# Patient Record
Sex: Female | Born: 1985 | Hispanic: No | Marital: Single | Smoking: Current every day smoker
Health system: Southern US, Community
[De-identification: ages and names within clinical notes are randomized; demographics above are authoritative.]

## PROBLEM LIST (undated history)

## (undated) DIAGNOSIS — F319 Bipolar disorder, unspecified: Secondary | ICD-10-CM

## (undated) DIAGNOSIS — I10 Essential (primary) hypertension: Secondary | ICD-10-CM

## (undated) DIAGNOSIS — F419 Anxiety disorder, unspecified: Secondary | ICD-10-CM

## (undated) DIAGNOSIS — R911 Solitary pulmonary nodule: Secondary | ICD-10-CM

## (undated) DIAGNOSIS — D739 Disease of spleen, unspecified: Secondary | ICD-10-CM

## (undated) DIAGNOSIS — F4310 Post-traumatic stress disorder, unspecified: Secondary | ICD-10-CM

## (undated) DIAGNOSIS — K769 Liver disease, unspecified: Secondary | ICD-10-CM

## (undated) DIAGNOSIS — D7389 Other diseases of spleen: Secondary | ICD-10-CM

## (undated) DIAGNOSIS — Z98891 History of uterine scar from previous surgery: Secondary | ICD-10-CM

## (undated) DIAGNOSIS — E119 Type 2 diabetes mellitus without complications: Secondary | ICD-10-CM

## (undated) DIAGNOSIS — B009 Herpesviral infection, unspecified: Secondary | ICD-10-CM

## (undated) DIAGNOSIS — R0989 Other specified symptoms and signs involving the circulatory and respiratory systems: Secondary | ICD-10-CM

## (undated) HISTORY — DX: Post-traumatic stress disorder, unspecified: F43.10

## (undated) HISTORY — DX: Bipolar disorder, unspecified: F31.9

## (undated) HISTORY — PX: OTHER SURGICAL HISTORY: SHX169

## (undated) HISTORY — DX: Anxiety disorder, unspecified: F41.9

## (undated) HISTORY — DX: History of uterine scar from previous surgery: Z98.891

---

## 2015-04-07 LAB — OB RESULTS CONSOLE ABO/RH: RH Type: POSITIVE

## 2015-04-07 LAB — OB RESULTS CONSOLE HEPATITIS B SURFACE ANTIGEN: HEP B S AG: NEGATIVE

## 2015-04-07 LAB — OB RESULTS CONSOLE RUBELLA ANTIBODY, IGM: RUBELLA: NON-IMMUNE/NOT IMMUNE

## 2015-04-07 LAB — OB RESULTS CONSOLE HIV ANTIBODY (ROUTINE TESTING): HIV: NONREACTIVE

## 2015-04-07 LAB — OB RESULTS CONSOLE GC/CHLAMYDIA
CHLAMYDIA, DNA PROBE: NEGATIVE
GC PROBE AMP, GENITAL: NEGATIVE

## 2015-04-07 LAB — OB RESULTS CONSOLE RPR: RPR: NONREACTIVE

## 2015-04-07 LAB — OB RESULTS CONSOLE ANTIBODY SCREEN: ANTIBODY SCREEN: NEGATIVE

## 2015-07-13 ENCOUNTER — Encounter: Payer: Medicaid Other | Attending: Obstetrics and Gynecology

## 2015-07-13 VITALS — Ht 67.0 in | Wt 200.5 lb

## 2015-07-13 DIAGNOSIS — O24419 Gestational diabetes mellitus in pregnancy, unspecified control: Secondary | ICD-10-CM | POA: Insufficient documentation

## 2015-07-13 DIAGNOSIS — Z713 Dietary counseling and surveillance: Secondary | ICD-10-CM | POA: Insufficient documentation

## 2015-07-19 NOTE — Progress Notes (Signed)
  Patient was seen on 07/13/15 for Gestational Diabetes self-management . The following learning objectives were met by the patient :   States the definition of Gestational Diabetes  States why dietary management is important in controlling blood glucose  Describes the effects of carbohydrates on blood glucose levels  Demonstrates ability to create a balanced meal plan  Demonstrates carbohydrate counting   States when to check blood glucose levels  Demonstrates proper blood glucose monitoring techniques  States the effect of stress and exercise on blood glucose levels  States the importance of limiting caffeine and abstaining from alcohol and smoking  Plan:  Aim for 2 Carb Choices per meal (30 grams) +/- 1 either way for breakfast Aim for 3 Carb Choices per meal (45 grams) +/- 1 either way from lunch and dinner Aim for 1-2 Carbs per snack Begin reading food labels for Total Carbohydrate and sugar grams of foods Consider  increasing your activity level by walking daily as tolerated Begin checking BG before breakfast and 1-2 hours after first bit of breakfast, lunch and dinner after  as directed by MD  Take medication  as directed by MD  Blood glucose monitor given: [employer] Lot # [id number]   Exp: 09-12-16  Patient instructed to monitor glucose levels: FBS: 60 - <90 1 hour: <140 2 hour: <120  Patient received the following handouts:  Nutrition Diabetes and Pregnancy  Carbohydrate Counting List  Meal Planning worksheet  Patient will be seen for follow-up as needed.

## 2015-09-06 ENCOUNTER — Encounter (HOSPITAL_COMMUNITY): Payer: Self-pay

## 2015-09-07 ENCOUNTER — Encounter (HOSPITAL_COMMUNITY)
Admission: RE | Admit: 2015-09-07 | Discharge: 2015-09-07 | Disposition: A | Discharge status: Home / Self Care | Payer: Medicaid Other | Source: Ambulatory Visit | Attending: Obstetrics and Gynecology | Admitting: Obstetrics and Gynecology

## 2015-09-07 ENCOUNTER — Encounter (HOSPITAL_COMMUNITY): Payer: Self-pay

## 2015-09-07 DIAGNOSIS — Z01812 Encounter for preprocedural laboratory examination: Secondary | ICD-10-CM | POA: Diagnosis present

## 2015-09-07 HISTORY — DX: Solitary pulmonary nodule: R91.1

## 2015-09-07 HISTORY — DX: Herpesviral infection, unspecified: B00.9

## 2015-09-07 HISTORY — DX: Liver disease, unspecified: K76.9

## 2015-09-07 HISTORY — DX: Type 2 diabetes mellitus without complications: E11.9

## 2015-09-07 HISTORY — DX: Disease of spleen, unspecified: D73.9

## 2015-09-07 HISTORY — DX: Essential (primary) hypertension: I10

## 2015-09-07 HISTORY — DX: Other specified symptoms and signs involving the circulatory and respiratory systems: R09.89

## 2015-09-07 HISTORY — DX: Other diseases of spleen: D73.89

## 2015-09-07 LAB — CBC
HEMATOCRIT: 41.3 % (ref 36.0–46.0)
HEMOGLOBIN: 14.3 g/dL (ref 12.0–15.0)
MCH: 31.2 pg (ref 26.0–34.0)
MCHC: 34.6 g/dL (ref 30.0–36.0)
MCV: 90 fL (ref 78.0–100.0)
PLATELETS: 236 10*3/uL (ref 150–400)
RBC: 4.59 MIL/uL (ref 3.87–5.11)
RDW: 13.4 % (ref 11.5–15.5)
WBC: 13.1 10*3/uL — AB (ref 4.0–10.5)

## 2015-09-07 LAB — ABO/RH: ABO/RH(D): B POS

## 2015-09-07 LAB — TYPE AND SCREEN
ABO/RH(D): B POS
Antibody Screen: NEGATIVE

## 2015-09-07 NOTE — Patient Instructions (Signed)
Your procedure is scheduled on:  Friday, Jan. 27, 2017  Enter through the Main Entrance of [facility] at:  7:00 A.M.  Pick up the phone at the desk and dial [phone].  Call this number if you have problems the morning of surgery: [phone].  Remember: Do NOT eat food or drink after:  After Midnight Thursday Take these medicines the morning of surgery with a SIP OF WATER:  Zoloft, Valtrex  Do NOT wear jewelry (body piercing), metal hair clips/bobby pins, or nail polish. Do NOT wear lotions, powders, or perfumes.  You may wear deoderant. Do NOT shave for 48 hours prior to surgery. Do NOT bring valuables to the hospital.  Leave suitcase in car.  After surgery it may be brought to your room.  For patients admitted to the hospital, checkout time is 11:00 AM the day of discharge.

## 2015-09-08 LAB — RPR: RPR Ser Ql: NONREACTIVE

## 2015-09-08 MED ORDER — GENTAMICIN SULFATE 40 MG/ML IJ SOLN
INTRAVENOUS | Status: AC
  Administered 2015-09-09: 370 mL via INTRAVENOUS
  Filled 2015-09-08: qty 9.25

## 2015-09-08 NOTE — H&P (Signed)
Shelly Little is a 30 y.o. female, G5 P1031, EGA [redacted] weeks with EDC 2-3 presenting for repeat c-section and BTL.  Previous LTCS, declines VBAC.  PNC complicated by A2GDM controlled with Glyburide, reactive NSTs, on Valtrex for suppression of HSV, desires BTL.   Maternal Medical History:  Fetal activity: Perceived fetal activity is normal.    Prenatal Complications - Diabetes: gestational. Diabetes is managed by oral agent (monotherapy).      OB History    Gravida Para Term Preterm AB TAB SAB Ectopic Multiple Living   LTCS for PIH and polyhydramnios, EAB x 2, SAB x 1  Past Medical History  Diagnosis Date  . Bipolar affective disorder (HCC)   . Anxiety   . PTSD (post-traumatic stress disorder)   . H/O: C-section   . Hypertension     patient's states occassionally is elevated since last pregnancy  . Poor circulation of extremity (HCC)     bilateral legs go numb when patient squats  . Diabetes mellitus without complication (HCC)     gestational with current pregnancy  . HSV infection   . Lesion of liver     age: 60 months  . Lesion of lung     age:73 months  . Lesion of spleen     age: 53 months   Past Surgical History  Procedure Laterality Date  . Cesarean section    . Skull surgery at 75 months of age     Family History: family history is not on file. Social History:  reports that she has been smoking Cigarettes.  She has a 8 pack-year smoking history. She has never used smokeless tobacco. She reports that she does not drink alcohol or use illicit drugs.   Prenatal Transfer Tool  Maternal Diabetes: Yes:  Diabetes Type:  Insulin/Medication controlled Genetic Screening: Normal Maternal Ultrasounds/Referrals: Normal Fetal Ultrasounds or other Referrals:  None Maternal Substance Abuse:  No Significant Maternal Medications:  Meds include: Other:  Significant Maternal Lab Results:  Lab values include: Group B Strep negative Other Comments:  Glyburide for  GDM  Review of Systems  Respiratory: Negative.   Cardiovascular: Negative.       Height  (1.702 m), weight 91.74 kg (202 lb 4 oz). Maternal Exam:  Abdomen: Surgical scars: low transverse.   Estimated fetal weight is 7 1/2 lbs.   Fetal presentation: vertex  Introitus: Normal vulva. Normal vagina.    Physical Exam  Constitutional: She appears well-developed and well-nourished.  Neck: Neck supple. No thyromegaly present.  Cardiovascular: Normal rate, regular rhythm and normal heart sounds.   No murmur heard. Respiratory: Effort normal and breath sounds normal. No respiratory distress. She has no wheezes.  GI: Soft.    Prenatal labs: ABO, Rh: --/--/B POS, B POS (01/25 1120) Antibody: NEG (01/25 1120) Rubella: Nonimmune (08/25 0000) RPR: Non Reactive (01/25 1120)  HBsAg: Negative (08/25 0000)  HIV: Non-reactive (08/25 0000)  GBS:   neg  Assessment/Plan: IUP at 39 weeks, previous c-section, A2GDM, desires BTL.  Repeat c-section procedure and risks discussed, BTL risks, permanency and failure risks discussed.  Will admit for repeat c-section and BTL.     Deerica Waszak D 09/08/2015, 9:12 PM

## 2015-09-09 ENCOUNTER — Encounter (HOSPITAL_COMMUNITY)
Admission: RE | Disposition: A | Discharge status: Home / Self Care | Payer: Self-pay | Source: Ambulatory Visit | Attending: Obstetrics and Gynecology

## 2015-09-09 ENCOUNTER — Inpatient Hospital Stay (HOSPITAL_COMMUNITY): Payer: Medicaid Other | Admitting: Anesthesiology

## 2015-09-09 ENCOUNTER — Encounter (HOSPITAL_COMMUNITY): Payer: Self-pay

## 2015-09-09 ENCOUNTER — Inpatient Hospital Stay (HOSPITAL_COMMUNITY)
Admission: RE | Admit: 2015-09-09 | Discharge: 2015-09-11 | DRG: 767 | Disposition: A | Discharge status: Home / Self Care | Payer: Medicaid Other | Source: Ambulatory Visit | Attending: Obstetrics and Gynecology | Admitting: Obstetrics and Gynecology

## 2015-09-09 DIAGNOSIS — F4310 Post-traumatic stress disorder, unspecified: Secondary | ICD-10-CM | POA: Diagnosis present

## 2015-09-09 DIAGNOSIS — Z302 Encounter for sterilization: Secondary | ICD-10-CM

## 2015-09-09 DIAGNOSIS — B009 Herpesviral infection, unspecified: Secondary | ICD-10-CM | POA: Diagnosis present

## 2015-09-09 DIAGNOSIS — Z3A39 39 weeks gestation of pregnancy: Secondary | ICD-10-CM

## 2015-09-09 DIAGNOSIS — O9852 Other viral diseases complicating childbirth: Secondary | ICD-10-CM | POA: Diagnosis present

## 2015-09-09 DIAGNOSIS — F17210 Nicotine dependence, cigarettes, uncomplicated: Secondary | ICD-10-CM | POA: Diagnosis present

## 2015-09-09 DIAGNOSIS — F419 Anxiety disorder, unspecified: Secondary | ICD-10-CM | POA: Diagnosis present

## 2015-09-09 DIAGNOSIS — O99334 Smoking (tobacco) complicating childbirth: Secondary | ICD-10-CM | POA: Diagnosis present

## 2015-09-09 DIAGNOSIS — O99344 Other mental disorders complicating childbirth: Secondary | ICD-10-CM | POA: Diagnosis present

## 2015-09-09 DIAGNOSIS — O24425 Gestational diabetes mellitus in childbirth, controlled by oral hypoglycemic drugs: Secondary | ICD-10-CM | POA: Diagnosis present

## 2015-09-09 DIAGNOSIS — Z98891 History of uterine scar from previous surgery: Secondary | ICD-10-CM

## 2015-09-09 DIAGNOSIS — O34211 Maternal care for low transverse scar from previous cesarean delivery: Secondary | ICD-10-CM | POA: Diagnosis present

## 2015-09-09 DIAGNOSIS — O164 Unspecified maternal hypertension, complicating childbirth: Secondary | ICD-10-CM | POA: Diagnosis present

## 2015-09-09 LAB — GLUCOSE, CAPILLARY
GLUCOSE-CAPILLARY: 80 mg/dL (ref 65–99)
Glucose-Capillary: 92 mg/dL (ref 65–99)

## 2015-09-09 SURGERY — Surgical Case
Anesthesia: Spinal | Laterality: Bilateral

## 2015-09-09 MED ORDER — SCOPOLAMINE 1 MG/3DAYS TD PT72
1.0000 | MEDICATED_PATCH | Freq: Once | TRANSDERMAL | Status: DC
Start: 2015-09-09 — End: 2015-09-11

## 2015-09-09 MED ORDER — NICOTINE 21 MG/24HR TD PT24
21.0000 mg | MEDICATED_PATCH | Freq: Every day | TRANSDERMAL | Status: DC
  Administered 2015-09-09 – 2015-09-10 (×2): 21 mg via TRANSDERMAL
  Filled 2015-09-09 (×4): qty 1

## 2015-09-09 MED ORDER — DIPHENHYDRAMINE HCL 25 MG PO CAPS: 25.0000 mg | ORAL_CAPSULE | Freq: Four times a day (QID) | ORAL | Status: DC | PRN

## 2015-09-09 MED ORDER — LACTATED RINGERS IV SOLN
INTRAVENOUS | Status: DC
  Administered 2015-09-09: 23:00:00 via INTRAVENOUS

## 2015-09-09 MED ORDER — SCOPOLAMINE 1 MG/3DAYS TD PT72
MEDICATED_PATCH | TRANSDERMAL | Status: AC
  Administered 2015-09-09: 1.5 mg via TRANSDERMAL
  Filled 2015-09-09: qty 1

## 2015-09-09 MED ORDER — OLANZAPINE 2.5 MG PO TABS
2.5000 mg | ORAL_TABLET | Freq: Every day | ORAL | Status: DC
Start: 2015-09-09 — End: 2015-09-11
  Administered 2015-09-09 – 2015-09-10 (×2): 2.5 mg via ORAL
  Filled 2015-09-09 (×4): qty 1

## 2015-09-09 MED ORDER — OXYCODONE HCL 5 MG PO TABS
10.0000 mg | ORAL_TABLET | ORAL | Status: DC | PRN
  Administered 2015-09-09 – 2015-09-11 (×8): 10 mg via ORAL
  Filled 2015-09-09 (×8): qty 2

## 2015-09-09 MED ORDER — SIMETHICONE 80 MG PO CHEW
80.0000 mg | CHEWABLE_TABLET | ORAL | Status: DC | PRN
  Administered 2015-09-10 – 2015-09-11 (×2): 80 mg via ORAL
  Filled 2015-09-09 (×2): qty 1

## 2015-09-09 MED ORDER — MEPERIDINE HCL 25 MG/ML IJ SOLN: 6.2500 mg | INTRAMUSCULAR | Status: DC | PRN

## 2015-09-09 MED ORDER — FENTANYL CITRATE (PF) 100 MCG/2ML IJ SOLN
INTRAMUSCULAR | Status: AC
  Filled 2015-09-09: qty 2

## 2015-09-09 MED ORDER — LACTATED RINGERS IV SOLN
INTRAVENOUS | Status: DC
  Administered 2015-09-09 (×2): via INTRAVENOUS

## 2015-09-09 MED ORDER — KETOROLAC TROMETHAMINE 30 MG/ML IJ SOLN: 30.0000 mg | Freq: Once | INTRAMUSCULAR | Status: DC

## 2015-09-09 MED ORDER — PHENYLEPHRINE HCL 10 MG/ML IJ SOLN
INTRAMUSCULAR | Status: DC | PRN
  Administered 2015-09-09 (×2): 40 ug via INTRAVENOUS

## 2015-09-09 MED ORDER — SENNOSIDES-DOCUSATE SODIUM 8.6-50 MG PO TABS
2.0000 | ORAL_TABLET | ORAL | Status: DC
  Administered 2015-09-09 – 2015-09-11 (×2): 2 via ORAL
  Filled 2015-09-09 (×2): qty 2

## 2015-09-09 MED ORDER — NALBUPHINE HCL 10 MG/ML IJ SOLN: 5.0000 mg | INTRAMUSCULAR | Status: DC | PRN

## 2015-09-09 MED ORDER — SODIUM CHLORIDE 0.9 % IR SOLN
Status: DC | PRN
  Administered 2015-09-09: 1000 mL

## 2015-09-09 MED ORDER — MENTHOL 3 MG MT LOZG: 1.0000 | LOZENGE | OROMUCOSAL | Status: DC | PRN

## 2015-09-09 MED ORDER — SERTRALINE HCL 50 MG PO TABS
50.0000 mg | ORAL_TABLET | Freq: Every day | ORAL | Status: DC
  Administered 2015-09-10 – 2015-09-11 (×2): 50 mg via ORAL
  Filled 2015-09-09 (×3): qty 1

## 2015-09-09 MED ORDER — OXYCODONE HCL 5 MG PO TABS
5.0000 mg | ORAL_TABLET | ORAL | Status: DC | PRN
  Filled 2015-09-09: qty 1

## 2015-09-09 MED ORDER — ONDANSETRON HCL 4 MG/2ML IJ SOLN
INTRAMUSCULAR | Status: DC | PRN
  Administered 2015-09-09: 4 mg via INTRAVENOUS

## 2015-09-09 MED ORDER — PHENYLEPHRINE 8 MG IN D5W 100 ML (0.08MG/ML) PREMIX OPTIME
INJECTION | INTRAVENOUS | Status: DC | PRN
  Administered 2015-09-09: 60 ug/min via INTRAVENOUS

## 2015-09-09 MED ORDER — NALBUPHINE HCL 10 MG/ML IJ SOLN: 5.0000 mg | Freq: Once | INTRAMUSCULAR | Status: DC | PRN

## 2015-09-09 MED ORDER — PROMETHAZINE HCL 25 MG/ML IJ SOLN: 6.2500 mg | INTRAMUSCULAR | Status: DC | PRN

## 2015-09-09 MED ORDER — HYDROMORPHONE HCL 2 MG PO TABS
2.0000 mg | ORAL_TABLET | Freq: Once | ORAL | Status: AC
  Administered 2015-09-09: 2 mg via ORAL
  Filled 2015-09-09: qty 1

## 2015-09-09 MED ORDER — ONDANSETRON HCL 4 MG/2ML IJ SOLN: 4.0000 mg | Freq: Three times a day (TID) | INTRAMUSCULAR | Status: DC | PRN

## 2015-09-09 MED ORDER — TETANUS-DIPHTH-ACELL PERTUSSIS 5-2.5-18.5 LF-MCG/0.5 IM SUSP: 0.5000 mL | Freq: Once | INTRAMUSCULAR | Status: DC

## 2015-09-09 MED ORDER — PHENYLEPHRINE 40 MCG/ML (10ML) SYRINGE FOR IV PUSH (FOR BLOOD PRESSURE SUPPORT)
PREFILLED_SYRINGE | INTRAVENOUS | Status: AC
  Filled 2015-09-09: qty 10

## 2015-09-09 MED ORDER — KETOROLAC TROMETHAMINE 30 MG/ML IJ SOLN
30.0000 mg | Freq: Four times a day (QID) | INTRAMUSCULAR | Status: AC | PRN
  Administered 2015-09-09: 30 mg via INTRAMUSCULAR

## 2015-09-09 MED ORDER — ACETAMINOPHEN 325 MG PO TABS
650.0000 mg | ORAL_TABLET | ORAL | Status: DC | PRN
Start: 2015-09-09 — End: 2015-09-11
  Administered 2015-09-10 – 2015-09-11 (×5): 650 mg via ORAL
  Filled 2015-09-09 (×6): qty 2

## 2015-09-09 MED ORDER — NALOXONE HCL 2 MG/2ML IJ SOSY
1.0000 ug/kg/h | PREFILLED_SYRINGE | INTRAVENOUS | Status: DC | PRN
  Filled 2015-09-09: qty 2

## 2015-09-09 MED ORDER — MAGNESIUM HYDROXIDE 400 MG/5ML PO SUSP: 30.0000 mL | ORAL | Status: DC | PRN

## 2015-09-09 MED ORDER — IBUPROFEN 600 MG PO TABS: 600.0000 mg | ORAL_TABLET | Freq: Four times a day (QID) | ORAL | Status: DC | PRN

## 2015-09-09 MED ORDER — DIBUCAINE 1 % RE OINT: 1.0000 "application " | TOPICAL_OINTMENT | RECTAL | Status: DC | PRN

## 2015-09-09 MED ORDER — LACTATED RINGERS IV SOLN
INTRAVENOUS | Status: DC | PRN
  Administered 2015-09-09: 09:00:00 via INTRAVENOUS

## 2015-09-09 MED ORDER — MORPHINE SULFATE (PF) 0.5 MG/ML IJ SOLN
INTRAMUSCULAR | Status: AC
  Filled 2015-09-09: qty 10

## 2015-09-09 MED ORDER — DIPHENHYDRAMINE HCL 25 MG PO CAPS
25.0000 mg | ORAL_CAPSULE | ORAL | Status: DC | PRN
  Filled 2015-09-09: qty 1

## 2015-09-09 MED ORDER — IBUPROFEN 600 MG PO TABS
600.0000 mg | ORAL_TABLET | Freq: Four times a day (QID) | ORAL | Status: DC
  Administered 2015-09-09 – 2015-09-11 (×7): 600 mg via ORAL
  Filled 2015-09-09 (×7): qty 1

## 2015-09-09 MED ORDER — KETOROLAC TROMETHAMINE 30 MG/ML IJ SOLN
30.0000 mg | Freq: Four times a day (QID) | INTRAMUSCULAR | Status: AC | PRN
  Administered 2015-09-09: 30 mg via INTRAVENOUS
  Filled 2015-09-09 (×2): qty 1

## 2015-09-09 MED ORDER — KETOROLAC TROMETHAMINE 30 MG/ML IJ SOLN
INTRAMUSCULAR | Status: AC
  Filled 2015-09-09: qty 1

## 2015-09-09 MED ORDER — LANOLIN HYDROUS EX OINT: 1.0000 "application " | TOPICAL_OINTMENT | CUTANEOUS | Status: DC | PRN

## 2015-09-09 MED ORDER — HYDROMORPHONE HCL 1 MG/ML IJ SOLN
0.2500 mg | INTRAMUSCULAR | Status: DC | PRN
  Administered 2015-09-09: 0.25 mg via INTRAVENOUS

## 2015-09-09 MED ORDER — ONDANSETRON HCL 4 MG/2ML IJ SOLN
INTRAMUSCULAR | Status: AC
  Filled 2015-09-09: qty 2

## 2015-09-09 MED ORDER — BUPIVACAINE IN DEXTROSE 0.75-8.25 % IT SOLN
INTRATHECAL | Status: DC | PRN
  Administered 2015-09-09: 10.5 mg via INTRATHECAL

## 2015-09-09 MED ORDER — HYDROMORPHONE HCL 1 MG/ML IJ SOLN
INTRAMUSCULAR | Status: AC
  Filled 2015-09-09: qty 1

## 2015-09-09 MED ORDER — MORPHINE SULFATE (PF) 0.5 MG/ML IJ SOLN
INTRAMUSCULAR | Status: DC | PRN
  Administered 2015-09-09: .2 mg via INTRATHECAL

## 2015-09-09 MED ORDER — FENTANYL CITRATE (PF) 100 MCG/2ML IJ SOLN
INTRAMUSCULAR | Status: DC | PRN
  Administered 2015-09-09: 10 ug via INTRATHECAL

## 2015-09-09 MED ORDER — OXYTOCIN 10 UNIT/ML IJ SOLN: 2.5000 [IU]/h | INTRAVENOUS | Status: AC

## 2015-09-09 MED ORDER — SODIUM CHLORIDE 0.9% FLUSH: 3.0000 mL | INTRAVENOUS | Status: DC | PRN

## 2015-09-09 MED ORDER — MEASLES, MUMPS & RUBELLA VAC ~~LOC~~ INJ
0.5000 mL | INJECTION | Freq: Once | SUBCUTANEOUS | Status: AC
  Administered 2015-09-11: 0.5 mL via SUBCUTANEOUS
  Filled 2015-09-09 (×2): qty 0.5

## 2015-09-09 MED ORDER — NALOXONE HCL 0.4 MG/ML IJ SOLN: 0.4000 mg | INTRAMUSCULAR | Status: DC | PRN

## 2015-09-09 MED ORDER — SCOPOLAMINE 1 MG/3DAYS TD PT72
1.0000 | MEDICATED_PATCH | Freq: Once | TRANSDERMAL | Status: DC
Start: 2015-09-09 — End: 2015-09-09
  Administered 2015-09-09: 1.5 mg via TRANSDERMAL

## 2015-09-09 MED ORDER — PRENATAL MULTIVITAMIN CH
1.0000 | ORAL_TABLET | Freq: Every day | ORAL | Status: DC
  Administered 2015-09-10 – 2015-09-11 (×2): 1 via ORAL
  Filled 2015-09-09 (×2): qty 1

## 2015-09-09 MED ORDER — OXYTOCIN 10 UNIT/ML IJ SOLN
INTRAMUSCULAR | Status: AC
  Filled 2015-09-09: qty 4

## 2015-09-09 MED ORDER — WITCH HAZEL-GLYCERIN EX PADS: 1.0000 "application " | MEDICATED_PAD | CUTANEOUS | Status: DC | PRN

## 2015-09-09 MED ORDER — ACETAMINOPHEN 500 MG PO TABS
1000.0000 mg | ORAL_TABLET | Freq: Four times a day (QID) | ORAL | Status: AC
  Administered 2015-09-09 – 2015-09-10 (×3): 1000 mg via ORAL
  Filled 2015-09-09 (×3): qty 2

## 2015-09-09 MED ORDER — DIPHENHYDRAMINE HCL 50 MG/ML IJ SOLN: 12.5000 mg | INTRAMUSCULAR | Status: DC | PRN

## 2015-09-09 MED ORDER — ZOLPIDEM TARTRATE 5 MG PO TABS: 5.0000 mg | ORAL_TABLET | Freq: Every evening | ORAL | Status: DC | PRN

## 2015-09-09 MED ORDER — OXYTOCIN 10 UNIT/ML IJ SOLN
40.0000 [IU] | INTRAVENOUS | Status: DC | PRN
  Administered 2015-09-09: 40 [IU] via INTRAVENOUS

## 2015-09-09 MED ORDER — PHENYLEPHRINE 8 MG IN D5W 100 ML (0.08MG/ML) PREMIX OPTIME
INJECTION | INTRAVENOUS | Status: AC
  Filled 2015-09-09: qty 100

## 2015-09-09 SURGICAL SUPPLY — 36 items
BENZOIN TINCTURE PRP APPL 2/3 (GAUZE/BANDAGES/DRESSINGS) ×3 IMPLANT
CLAMP CORD UMBIL (MISCELLANEOUS) IMPLANT
CLOSURE STERI-STRIP 1/2X4 (GAUZE/BANDAGES/DRESSINGS) ×1
CLOTH BEACON ORANGE TIMEOUT ST (SAFETY) ×3 IMPLANT
CLSR STERI-STRIP ANTIMIC 1/2X4 (GAUZE/BANDAGES/DRESSINGS) ×2 IMPLANT
CONTAINER PREFILL 10% NBF 15ML (MISCELLANEOUS) IMPLANT
DRAPE SHEET LG 3/4 BI-LAMINATE (DRAPES) IMPLANT
DRSG OPSITE POSTOP 4X10 (GAUZE/BANDAGES/DRESSINGS) ×3 IMPLANT
DURAPREP 26ML APPLICATOR (WOUND CARE) ×3 IMPLANT
ELECT REM PT RETURN 9FT ADLT (ELECTROSURGICAL) ×3
ELECTRODE REM PT RTRN 9FT ADLT (ELECTROSURGICAL) ×1 IMPLANT
EXTRACTOR VACUUM KIWI (MISCELLANEOUS) IMPLANT
EXTRACTOR VACUUM M CUP 4 TUBE (SUCTIONS) IMPLANT
EXTRACTOR VACUUM M CUP 4' TUBE (SUCTIONS)
GLOVE BIO SURGEON STRL SZ8 (GLOVE) ×3 IMPLANT
GLOVE BIOGEL PI IND STRL 7.0 (GLOVE) ×1 IMPLANT
GLOVE BIOGEL PI INDICATOR 7.0 (GLOVE) ×2
GLOVE ORTHO TXT STRL SZ7.5 (GLOVE) ×3 IMPLANT
GOWN STRL REUS W/TWL LRG LVL3 (GOWN DISPOSABLE) ×6 IMPLANT
KIT ABG SYR 3ML LUER SLIP (SYRINGE) IMPLANT
NEEDLE HYPO 25X5/8 SAFETYGLIDE (NEEDLE) ×3 IMPLANT
NS IRRIG 1000ML POUR BTL (IV SOLUTION) ×3 IMPLANT
PACK C SECTION WH (CUSTOM PROCEDURE TRAY) ×3 IMPLANT
PAD OB MATERNITY 4.3X12.25 (PERSONAL CARE ITEMS) ×3 IMPLANT
PENCIL SMOKE EVAC W/HOLSTER (ELECTROSURGICAL) IMPLANT
RTRCTR C-SECT PINK 25CM LRG (MISCELLANEOUS) ×3 IMPLANT
SUT CHROMIC 1 CTX 36 (SUTURE) ×6 IMPLANT
SUT PLAIN 0 NONE (SUTURE) IMPLANT
SUT PLAIN 2 0 XLH (SUTURE) IMPLANT
SUT VIC AB 0 CT1 27 (SUTURE) ×4
SUT VIC AB 0 CT1 27XBRD ANBCTR (SUTURE) ×2 IMPLANT
SUT VIC AB 2-0 CT1 27 (SUTURE) ×2
SUT VIC AB 2-0 CT1 TAPERPNT 27 (SUTURE) ×1 IMPLANT
SUT VIC AB 4-0 KS 27 (SUTURE) ×3 IMPLANT
TOWEL OR 17X24 6PK STRL BLUE (TOWEL DISPOSABLE) ×3 IMPLANT
TRAY FOLEY CATH SILVER 14FR (SET/KITS/TRAYS/PACK) ×3 IMPLANT

## 2015-09-09 NOTE — Addendum Note (Signed)
Addendum  created 09/09/15 1630 by Shanon Payor, CRNA   Modules edited: Clinical Notes   Clinical Notes:  File: [id number]

## 2015-09-09 NOTE — Anesthesia Postprocedure Evaluation (Signed)
Anesthesia Post Note  Patient: Shelly Little  Procedure(s) Performed: Procedure(s) (LRB): CESAREAN SECTION WITH BILATERAL TUBAL LIGATION (Bilateral)  Patient location during evaluation: PACU Anesthesia Type: Spinal Level of consciousness: awake Pain management: pain level controlled Vital Signs Assessment: post-procedure vital signs reviewed and stable Respiratory status: spontaneous breathing Cardiovascular status: stable Postop Assessment: no headache, no backache, spinal receding, patient able to bend at knees and no signs of nausea or vomiting Anesthetic complications: no    Last Vitals:  Filed Vitals:   09/09/15 1041 09/09/15 1043  BP:    Pulse: 97 91  Temp:    Resp: 20 15    Last Pain:  Filed Vitals:   09/09/15 1045  PainSc: 5                  Lochlan Grygiel JR,JOHN Casidy [place]

## 2015-09-09 NOTE — Anesthesia Preprocedure Evaluation (Signed)
Anesthesia Evaluation  Patient identified by MRN, date of birth, ID band Patient awake    Reviewed: Allergy & Precautions, H&P , NPO status , Patient's Chart, lab work & pertinent test results  Airway Mallampati: II  TM Distance: >3 FB Neck ROM: full    Dental no notable dental hx.    Pulmonary Current Smoker,    Pulmonary exam normal        Cardiovascular hypertension, Normal cardiovascular exam     Neuro/Psych negative neurological ROS  negative psych ROS   GI/Hepatic negative GI ROS, Neg liver ROS,   Endo/Other  diabetes  Renal/GU negative Renal ROS     Musculoskeletal   Abdominal Normal abdominal exam  (+)   Peds  Hematology negative hematology ROS (+)   Anesthesia Other Findings   Reproductive/Obstetrics (+) Pregnancy                             Anesthesia Physical Anesthesia Plan  ASA: II  Anesthesia Plan: Spinal   Post-op Pain Management:    Induction:   Airway Management Planned:   Additional Equipment:   Intra-op Plan:   Post-operative Plan:   Informed Consent: I have reviewed the patients History and Physical, chart, labs and discussed the procedure including the risks, benefits and alternatives for the proposed anesthesia with the patient or authorized representative who has indicated his/her understanding and acceptance.     Plan Discussed with: CRNA and Surgeon  Anesthesia Plan Comments:         Anesthesia Quick Evaluation

## 2015-09-09 NOTE — Op Note (Signed)
Preoperative diagnosis: Intrauterine pregnancy at 39 weeks, previous c-section, desires permanent sterility Postoperative diagnosis: Same Procedure: Repeat low transverse cesarean section without extensions, bilateral partial salpingectomy Surgeon: Lavina Hamman M.D. Assistant:  Doristine Counter, D.O. Anesthesia: Spinal  Findings: Patient had normal gravid anatomy and delivered a viable female infant with Apgars of 9 and 9 weight pending Estimated blood loss: 800 cc Specimens: Placenta sent to labor and delivery, segments of both tubes for routine pathology Complications: None  Procedure in detail: The patient was taken to the operating room and placed in the sitting position. Dr. Arby Barrette instilled spinal anesthesia.  She was then placed in the dorsosupine position with left tilt. Abdomen was then prepped and draped in the usual sterile fashion, and a foley catheter was inserted. The level of her anesthesia was found to be adequate. Abdomen was entered via a standard Pfannenstiel incision through her previous scar. Once the peritoneal cavity was entered the Alexis disposable self-retaining retractor was placed and good visualization was achieved after omental adhesions were taken down. A 4 cm transverse incision was then made in the lower uterine segment pushing the bladder inferior. Once the uterine cavity was entered the incision was extended digitally. The fetal vertex was grasped and delivered through the incision atraumatically. Mouth and nares were suctioned. The remainder of the infant then delivered atraumatically. Cord was doubly clamped and cut and the infant handed to the awaiting pediatric team. Cord blood was obtained. The placenta delivered spontaneously. Uterus was wiped dry with clean lap pad and all clots and debris were removed. Uterine incision was inspected and found to be free of extensions. Uterine incision was closed in 1 layer with running locking #1 Chromic. Tubes and ovaries were  inspected and found to be normal. The middle portion of each fallopian tube was grasped with a Babcock clamp and elevated.  A 0 plain gut tie was then used to ligate a knuckle of tube on each side.  The knuckle of tube was removed sharply, both ostia were identified and the stumps were hemostatic.  Uterine incision was inspected and found to be hemostatic. Bleeding from serosal edges was controlled with electrocautery. The Alexis retractor was removed. Subfascial space was irrigated and made hemostatic with electrocautery. Peritoneum was closed with 2-0 Vicryl.  Fascia was closed in running fashion starting at both ends and meeting in the middle with 0 Vicryl. Subcutaneous tissue was then irrigated and made hemostatic with electrocautery, then closed with running 2-0 plain gut. Skin was closed with running 4-0 Vicryl subcuticular suture followed by steri-strips and a sterile dressing. Patient tolerated the procedure well and was taken to the recovery in stable condition. Counts were correct x2, she received Gent and Clinda IV at the beginning of the procedure and she had PAS hose on throughout the procedure.

## 2015-09-09 NOTE — Anesthesia Procedure Notes (Addendum)
Spinal Patient location during procedure: OR Start time: 09/09/2015 8:21 AM End time: 09/09/2015 8:23 AM Staffing Anesthesiologist: Leilani Able Performed by: anesthesiologist  Preanesthetic Checklist Completed: patient identified, surgical consent, pre-op evaluation, timeout performed, IV checked, risks and benefits discussed and monitors and equipment checked Spinal Block Patient position: sitting Prep: site prepped and draped and DuraPrep Patient monitoring: heart rate, cardiac monitor, continuous pulse ox and blood pressure Approach: midline Location: L3-4 Injection technique: single-shot Needle Needle type: Sprotte  Needle gauge: 24 G Needle length: 9 cm Needle insertion depth: 6 cm Assessment Sensory level: T4

## 2015-09-09 NOTE — Transfer of Care (Signed)
Immediate Anesthesia Transfer of Care Note  Patient: Shelly Little  Procedure(s) Performed: Procedure(s): CESAREAN SECTION WITH BILATERAL TUBAL LIGATION (Bilateral)  Patient Location: PACU  Anesthesia Type:Spinal  Level of Consciousness: awake  Airway & Oxygen Therapy: Patient Spontanous Breathing  Post-op Assessment: Report given to RN  Post vital signs: Reviewed and stable  Last Vitals:  Filed Vitals:   09/09/15 0708  Pulse: 120  Temp: 36.7 C  Resp: 20    Complications: No apparent anesthesia complications

## 2015-09-09 NOTE — Lactation Note (Signed)
This note was copied from the chart of Shelly Little. Lactation Consultation Note Initial visit at 8 hours of age.  MOm reports 2 good feedings and now baby is sleepy.  Mom tried to latch baby about 1 hour ago with only a few sucks, but mom is able to hand express drops to baby.  Encouraged mom to keep trying with feeding cues and explained normal behavior during 1st 24 hours.  Baby is asleep on moms chest now.  [facility] LC resources given and discussed.  Encouraged to feed with early cues on demand.  Early newborn behavior discussed.  Hand expression reported by mom with colostrum visible and mom is aware of spoon feedings.  Mom to call for assist as needed.    Patient Name: Shelly Little [id number] Date: 09/09/2015 Reason for consult: Initial assessment   Maternal Data Has patient been taught Hand Expression?: Yes Does the patient have breastfeeding experience prior to this delivery?: No  Feeding Feeding Type: Breast Fed Length of feed:  (drops expressed with few sucks)  LATCH Score/Interventions                Intervention(s): Breastfeeding basics reviewed;Skin to skin     Lactation Tools Discussed/Used WIC Program: Yes   Consult Status Consult Status: Follow-up Date: 09/10/15 Follow-up type: In-patient    Emmitte Surgeon, Arvella Merles 09/09/2015, 5:17 PM

## 2015-09-09 NOTE — Anesthesia Postprocedure Evaluation (Signed)
Anesthesia Post Note  Patient: Shelly Little  Procedure(s) Performed: Procedure(s) (LRB): CESAREAN SECTION WITH BILATERAL TUBAL LIGATION (Bilateral)  Patient location during evaluation: Mother Baby Anesthesia Type: Epidural Level of consciousness: awake and alert and oriented Pain management: pain level controlled Vital Signs Assessment: post-procedure vital signs reviewed and stable Respiratory status: spontaneous breathing and nonlabored ventilation Cardiovascular status: stable Postop Assessment: no headache, no backache, patient able to bend at knees, no signs of nausea or vomiting and adequate PO intake Anesthetic complications: no    Last Vitals:  Filed Vitals:   09/09/15 1150 09/09/15 1258  BP: 145/87 130/91  Pulse: 88 77  Temp: 36.4 C 36.7 C  Resp: 20 18    Last Pain:  Filed Vitals:   09/09/15 1450  PainSc: 5                  Kortlyn Koltz

## 2015-09-09 NOTE — Consult Note (Signed)
Neonatology Note:   Attendance at C-section:   I was asked by Dr. Meisinger to attend this repeat C/S at term. The mother is a G2P1 B pos, GBS neg with GDM on glyburide, bipolar disorder, anxiety, PTSD, and a history of HSV. ROM at delivery, fluid clear. Infant vigorous with good spontaneous cry and tone. Delayed cord clamping was done. Needed bulb suctioning for a large amount of oral secretions, then he pinked up. At about 5 minutes, however, he appeared dusky, so we placed a pulse oximeter which showed the O2 saturation to be 50%. We gave BBO2 and performed DeLee suctioning, getting 12 ml of clear fluid/mucous out. We gradually withdrew the supplemental O2 and he remained pink, with O2 sats > 90% in room air for 5 minutes before I left him with his transition nurse. Ap 8/8/9. Lungs clear to ausc in DR. I instructed his nurse to leave the pulse oximeter on him for a few more minutes while having skin to skin time and I spoke with his parents. Transferred to the care of Pediatrician.  Lindey Renzulli C. Square Jowett, MD 

## 2015-09-09 NOTE — Interval H&P Note (Signed)
History and Physical Interval Note:  09/09/2015 8:02 AM  Shelly Little  has presented today for surgery, with the diagnosis of Repeat C/Section, Sterilization  The various methods of treatment have been discussed with the patient and family. After consideration of risks, benefits and other options for treatment, the patient has consented to  Procedure(s): CESAREAN SECTION WITH BILATERAL TUBAL LIGATION (Bilateral) as a surgical intervention .  The patient's history has been reviewed, patient examined, no change in status, stable for surgery.  I have reviewed the patient's chart and labs.  Questions were answered to the patient's satisfaction.     Candid Bovey D

## 2015-09-09 NOTE — Addendum Note (Signed)
Addendum  created 09/09/15 1226 by Algis Greenhouse, CRNA   Modules edited: Anesthesia Flowsheet

## 2015-09-10 LAB — CBC
HCT: 35.7 % — ABNORMAL LOW (ref 36.0–46.0)
HEMOGLOBIN: 12.2 g/dL (ref 12.0–15.0)
MCH: 31.2 pg (ref 26.0–34.0)
MCHC: 34.2 g/dL (ref 30.0–36.0)
MCV: 91.3 fL (ref 78.0–100.0)
PLATELETS: 207 10*3/uL (ref 150–400)
RBC: 3.91 MIL/uL (ref 3.87–5.11)
RDW: 13.6 % (ref 11.5–15.5)
WBC: 12.2 10*3/uL — AB (ref 4.0–10.5)

## 2015-09-10 LAB — BIRTH TISSUE RECOVERY COLLECTION (PLACENTA DONATION)

## 2015-09-10 MED ORDER — PNEUMOCOCCAL VAC POLYVALENT 25 MCG/0.5ML IJ INJ
0.5000 mL | INJECTION | Freq: Once | INTRAMUSCULAR | Status: AC
  Administered 2015-09-10: 0.5 mL via INTRAMUSCULAR
  Filled 2015-09-10: qty 0.5

## 2015-09-10 NOTE — Progress Notes (Signed)
Acknowledged order for social work consult regarding mother's hx of PTSD and anxiety * Referral screened out by Clinical Social Worker because patient is currently being treated.  * Patient's symptoms currently being treated with zyprexa and zoloft.  CSW completed chart review and spoke with MOB's nurse.  RN reported no social concerns at this time.    Please contact the Clinical Social Worker if needs arise, or by the patient's request.      

## 2015-09-10 NOTE — Progress Notes (Signed)
Subjective: Postpartum Day 1: Cesarean Delivery Patient reports incisional pain and tolerating PO.  Nicotine patch for smoking at pt request.    Objective: Vital signs in last 24 hours: Temp:  [97.6 F (36.4 C)-98.8 F (37.1 C)] 97.8 F (36.6 C) (01/28 0500) Pulse Rate:  [25-103] 93 (01/28 0500) Resp:  [12-23] 18 (01/28 0500) BP: (110-145)/(61-97) 132/75 mmHg (01/28 0500) SpO2:  [77 %-100 %] 100 % (01/28 0500)  Physical Exam:  General: alert and no distress Lochia: appropriate Uterine Fundus: firm Incision: healing well DVT Evaluation: No evidence of DVT seen on physical exam.   Recent Labs  09/07/15 1120 09/10/15 0541  HGB 14.3 12.2  HCT 41.3 35.7*    Assessment/Plan: Status post Cesarean section. Doing well postoperatively.  Continue current care.  Bovard-Stuckert, Declyn Offield 09/10/2015, 8:19 AM

## 2015-09-11 MED ORDER — IBUPROFEN 800 MG PO TABS: 800.0000 mg | ORAL_TABLET | Freq: Three times a day (TID) | ORAL | Status: DC | PRN

## 2015-09-11 MED ORDER — PRENATAL MULTIVITAMIN CH: 1.0000 | ORAL_TABLET | Freq: Every day | ORAL | Status: AC

## 2015-09-11 MED ORDER — OXYCODONE HCL 5 MG PO TABS: 5.0000 mg | ORAL_TABLET | Freq: Four times a day (QID) | ORAL | Status: DC | PRN

## 2015-09-11 NOTE — Discharge Summary (Signed)
OB Discharge Summary     Patient Name: Shelly Little DOB: [birth year] MRN: [id number]  Date of admission: 09/09/2015 Delivering MD: Jackelyn Knife, TODD   Date of discharge: 09/11/2015  Admitting diagnosis: Repeat C-Section, Sterilization Intrauterine pregnancy: [redacted]w[redacted]d     Secondary diagnosis:  Active Problems:   S/P cesarean section  Additional problems: bipolar     Discharge diagnosis: Term Pregnancy Delivered                                                                                                Post partum procedures:N/A  Augmentation: N/A  Complications: None  Hospital course:  Sceduled C/S   30 y.o. yo G2P2001 at [redacted]w[redacted]d was admitted to the hospital 09/09/2015 for scheduled cesarean section with the following indication:Elective Repeat.  Membrane Rupture Time/Date: 8:46 AM ,09/09/2015   Patient delivered a Viable infant.09/09/2015  Details of operation can be found in separate operative note.  Pateint had an uncomplicated postpartum course.  She is ambulating, tolerating a regular diet, passing flatus, and urinating well. Patient is discharged home in stable condition on  09/11/2015          Physical exam  Filed Vitals:   09/10/15 0900 09/10/15 1829 09/11/15 0000 09/11/15 0549  BP: 134/90 148/91 146/88 147/94  Pulse: 100 92 89 92  Temp: 97.5 F (36.4 C) 97.4 F (36.3 C)  97.9 F (36.6 C)  TempSrc: Oral Oral  Oral  Resp: Height:      Weight:      SpO2: 96% 100%     General: alert and no distress Lochia: appropriate Uterine Fundus: firm  Labs: Lab Results  Component Value Date   WBC 12.2* 09/10/2015   HGB 12.2 09/10/2015   HCT 35.7* 09/10/2015   MCV 91.3 09/10/2015   PLT 207 09/10/2015   No flowsheet data found.  Discharge instruction: per After Visit Summary and "Baby and Me Booklet".  After visit meds:    Medication List    STOP taking these medications        glyBURIDE 2.5 MG tablet  Commonly known as:  DIABETA     valACYclovir  500 MG tablet  Commonly known as:  VALTREX      TAKE these medications        acetaminophen 500 MG tablet  Commonly known as:  TYLENOL  Take 1,000 mg by mouth every 6 (six) hours as needed for mild pain or headache.     ibuprofen 800 MG tablet  Commonly known as:  ADVIL,MOTRIN  Take 1 tablet (800 mg total) by mouth every 8 (eight) hours as needed.     OLANZapine 2.5 MG tablet  Commonly known as:  ZYPREXA  Take 2.5 mg by mouth at bedtime.     oxyCODONE 5 MG immediate release tablet  Commonly known as:  Oxy IR/ROXICODONE  Take 1 tablet (5 mg total) by mouth every 6 (six) hours as needed for severe pain.     prenatal multivitamin Tabs tablet  Take 1 tablet by mouth daily.     sertraline 50 MG tablet  Commonly known as:  ZOLOFT  Take 50 mg by mouth daily.        Diet: routine diet  Activity: Advance as tolerated. Pelvic rest for 6 weeks.   Outpatient follow up:2 weeks and 6 weeks Follow up Appt:No future appointments. Follow up Visit:No Follow-up on file.  Postpartum contraception: Tubal Ligation  Newborn Data: Live born female  Birth Weight: 8 lb 14.3 oz (4035 g) APGAR: 8, 8  Baby Feeding: Breast Disposition:home with mother   09/11/2015 Sherian Rein, MD

## 2015-09-11 NOTE — Lactation Note (Signed)
This note was copied from the chart of Shelly Little. Lactation Consultation Note  Follow up visit made.  Mom states baby is now latching well.  She is also finger feeding small amounts of formula.  Discharge instructions given including engorgement treatment. Mom has a pump at home.  Instructed to post pump 4-5 times per day until milk is well established and baby feeding well. Baby is currently sleeping.  Encouraged to call for [facility] when baby starts to cue for latch assessment.  Patient Name: Shelly Little [id number] Date: 09/11/2015     Maternal Data    Feeding Feeding Type: Formula  LATCH Score/Interventions                      Lactation Tools Discussed/Used     Consult Status      [name] 09/11/2015, 9:53 AM

## 2015-09-11 NOTE — Progress Notes (Addendum)
Subjective: Postpartum Day 2: Cesarean Delivery Patient reports incisional pain, tolerating PO and no problems voiding.  Nl lochia, pain controlled  Objective: Vital signs in last 24 hours: Temp:  [97.4 F (36.3 C)-97.9 F (36.6 C)] 97.9 F (36.6 C) (01/29 0549) Pulse Rate:  [89-100] 92 (01/29 0549) Resp:  [16-18] 18 (01/29 0549) BP: (134-148)/(88-94) 147/94 mmHg (01/29 0549) SpO2:  [96 %-100 %] 100 % (01/28 1829)  Physical Exam:  General: alert and no distress Lochia: appropriate Uterine Fundus: firm Incision: healing well DVT Evaluation: No evidence of DVT seen on physical exam.   Recent Labs  09/10/15 0541  HGB 12.2  HCT 35.7*    Assessment/Plan: Status post Cesarean section. Doing well postoperatively.  Continue current care.  Bovard-Stuckert, Salina Stanfield 09/11/2015, 7:45 AM  Pt requests d/c.  If baby d/c'd will d/c with Mortrin, percocet and PNV.  F/u 2 and 6 weeks

## 2015-09-12 ENCOUNTER — Encounter (HOSPITAL_COMMUNITY): Payer: Self-pay | Admitting: Obstetrics and Gynecology

## 2016-03-25 ENCOUNTER — Emergency Department (HOSPITAL_COMMUNITY): Payer: Medicaid Other

## 2016-03-25 ENCOUNTER — Encounter (HOSPITAL_COMMUNITY): Payer: Self-pay

## 2016-03-25 ENCOUNTER — Emergency Department (HOSPITAL_COMMUNITY)
Admission: EM | Admit: 2016-03-25 | Discharge: 2016-03-25 | Disposition: A | Discharge status: Home / Self Care | Payer: Medicaid Other | Attending: Emergency Medicine | Admitting: Emergency Medicine

## 2016-03-25 DIAGNOSIS — E119 Type 2 diabetes mellitus without complications: Secondary | ICD-10-CM | POA: Insufficient documentation

## 2016-03-25 DIAGNOSIS — R079 Chest pain, unspecified: Secondary | ICD-10-CM | POA: Insufficient documentation

## 2016-03-25 DIAGNOSIS — R0602 Shortness of breath: Secondary | ICD-10-CM | POA: Diagnosis not present

## 2016-03-25 DIAGNOSIS — I10 Essential (primary) hypertension: Secondary | ICD-10-CM | POA: Diagnosis not present

## 2016-03-25 DIAGNOSIS — Z791 Long term (current) use of non-steroidal anti-inflammatories (NSAID): Secondary | ICD-10-CM | POA: Diagnosis not present

## 2016-03-25 DIAGNOSIS — F17210 Nicotine dependence, cigarettes, uncomplicated: Secondary | ICD-10-CM | POA: Diagnosis not present

## 2016-03-25 DIAGNOSIS — Z7982 Long term (current) use of aspirin: Secondary | ICD-10-CM | POA: Diagnosis not present

## 2016-03-25 LAB — CBC
HCT: 39.7 % (ref 36.0–46.0)
HEMOGLOBIN: 13.7 g/dL (ref 12.0–15.0)
MCH: 31.4 pg (ref 26.0–34.0)
MCHC: 34.5 g/dL (ref 30.0–36.0)
MCV: 91.1 fL (ref 78.0–100.0)
PLATELETS: 218 10*3/uL (ref 150–400)
RBC: 4.36 MIL/uL (ref 3.87–5.11)
RDW: 12.3 % (ref 11.5–15.5)
WBC: 9.3 10*3/uL (ref 4.0–10.5)

## 2016-03-25 LAB — BASIC METABOLIC PANEL
ANION GAP: 7 (ref 5–15)
BUN: 20 mg/dL (ref 6–20)
CALCIUM: 8.7 mg/dL — AB (ref 8.9–10.3)
CO2: 23 mmol/L (ref 22–32)
Chloride: 106 mmol/L (ref 101–111)
Creatinine, Ser: 0.83 mg/dL (ref 0.44–1.00)
GLUCOSE: 109 mg/dL — AB (ref 65–99)
Potassium: 3.9 mmol/L (ref 3.5–5.1)
Sodium: 136 mmol/L (ref 135–145)

## 2016-03-25 LAB — I-STAT TROPONIN, ED: TROPONIN I, POC: 0 ng/mL (ref 0.00–0.08)

## 2016-03-25 LAB — D-DIMER, QUANTITATIVE: D-Dimer, Quant: <0.27 ug/mL-FEU (ref 0.00–0.50)

## 2016-03-25 MED ORDER — DIAZEPAM 5 MG PO TABS: 5.0000 mg | ORAL_TABLET | Freq: Three times a day (TID) | ORAL | 0 refills | Status: AC | PRN

## 2016-03-25 MED ORDER — DIAZEPAM 5 MG PO TABS
10.0000 mg | ORAL_TABLET | Freq: Once | ORAL | Status: AC
  Administered 2016-03-25: 10 mg via ORAL
  Filled 2016-03-25: qty 2

## 2016-03-25 MED ORDER — KETOROLAC TROMETHAMINE 30 MG/ML IJ SOLN
60.0000 mg | Freq: Once | INTRAMUSCULAR | Status: AC
  Administered 2016-03-25: 60 mg via INTRAMUSCULAR
  Filled 2016-03-25: qty 2

## 2016-03-25 NOTE — Discharge Instructions (Signed)
Use ibuprofen 400 mg 3 times a day as needed for pain.  Try using heat on the sore area of your chest, 3 or 4 times a day.  Consider seeing a psychiatrist for reinitiation of your medications.

## 2016-03-25 NOTE — ED Notes (Signed)
Pt transported to XRay 

## 2016-03-25 NOTE — ED Triage Notes (Signed)
Patient states that has centralized chest pain that began x3 days ago and has gotten worse tonight.  Patient states that it makes her feel SOB.  Denies N/V, denies back pain.  Patient denies any recent illness.  Patient states that has a history of anxiety and has been off her medications x4 months because she has not been back to her doctor.  Patient states was on Zoloft and Zyprexa.  Patient states pain 10/10

## 2016-03-25 NOTE — ED Provider Notes (Signed)
[facility]-EMERGENCY DEPT Provider Note   CSN: [id number] Arrival date & time: 03/25/16  0258  By signing my name below, I, Shelly Little, attest that this documentation has been prepared under the direction and in the presence of [name], MD. Electronically Signed: Phillis Little, ED Scribe. 03/25/16. 3:48 AM.  First Provider Contact:  None    History   Chief Complaint Chief Complaint  Patient presents with  . Chest Pain   The history is provided by the patient. No language interpreter was used.   HPI Comments: Shelly Little is a 30 y.o. female with a hx of anxiety, bipolar disorder, DM, HTN, and PTSD who presents to the Emergency Department complaining of gradually worsening, stabbing, centralized chest pain onset 3 days ago, worsening tonight. She reports worsening pain with certain movements and deep breathing. She rates her pain 10/10. She reports associated intermittent SOB. Pt states that she has not taken her anxiety medication in 4 months because she has not seen her doctor. She was on Zoloft and Zyprexa. Pt has been taking Ibuprofen for her symptoms to no relief. Pt states that stress is a common factor in her life, but there has not been any new stressors recently. Pt is currently unemployed but in the process of getting a new job. She denies nausea, vomiting, or back pain.   Past Medical History:  Diagnosis Date  . Anxiety   . Bipolar affective disorder (HCC)   . Diabetes mellitus without complication (HCC)    gestational with current pregnancy  . H/O: C-section   . HSV infection   . Hypertension    patient's states occassionally is elevated since last pregnancy  . Lesion of liver    age: 28 months  . Lesion of lung    age:80 months  . Lesion of spleen    age: 5 months  . Poor circulation of extremity (HCC)    bilateral legs go numb when patient squats  . PTSD (post-traumatic stress disorder)     Patient Active Problem List   Diagnosis Date Noted  . S/P  cesarean section 09/09/2015    Past Surgical History:  Procedure Laterality Date  . CESAREAN SECTION    . CESAREAN SECTION WITH BILATERAL TUBAL LIGATION Bilateral 09/09/2015   Procedure: CESAREAN SECTION WITH BILATERAL TUBAL LIGATION;  Surgeon: Lavina Hamman, MD;  Location: [facility];  Service: Obstetrics;  Laterality: Bilateral;  . skull surgery at 53 months of age      OB History    82 Para Term Preterm AB Living   2 2 2     1    SAB TAB Ectopic Multiple Live Births         0 1       Home Medications    Prior to Admission medications   Medication Sig Start Date End Date Taking? Authorizing Provider  Aspirin-Salicylamide-Caffeine (BC HEADACHE POWDER PO) Take 1 packet by mouth every 8 (eight) hours as needed (for pain.).   Yes Historical Provider, MD  ibuprofen (ADVIL,MOTRIN) 200 MG tablet Take 800 mg by mouth every 8 (eight) hours as needed (for pain.).   Yes Historical Provider, MD  diazepam (VALIUM) 5 MG tablet Take 1 tablet (5 mg total) by mouth every 8 (eight) hours as needed for anxiety or muscle spasms (spasms). 03/25/16   [name], MD  Prenatal Vit-Fe Fumarate-FA (PRENATAL MULTIVITAMIN) TABS tablet Take 1 tablet by mouth daily. Patient not taking: Reported on 03/25/2016 09/11/15   Sherian Rein, MD  Family History No family history on file.  Social History Social History  Substance Use Topics  . Smoking status: Current Every Day Smoker    Packs/day: 0.50    Years: 16.00    Types: Cigarettes  . Smokeless tobacco: Never Used  . Alcohol use No     Allergies   Penicillins   Review of Systems Review of Systems  Respiratory: Positive for shortness of breath.   Cardiovascular: Positive for chest pain.  All other systems reviewed and are negative.  Physical Exam Updated Vital Signs BP 124/82   Pulse 79   Temp 97.9 F (36.6 C) (Oral)   Resp 12   Ht  (1.702 m)   Wt 194 lb (88 kg)   LMP 03/04/2016 (Approximate)   SpO2 99%   BMI 30.38  kg/m   Physical Exam  Constitutional: She is oriented to person, place, and time. She appears well-developed and well-nourished.  HENT:  Head: Normocephalic and atraumatic.  Eyes: Conjunctivae and EOM are normal. Pupils are equal, round, and reactive to light.  Neck: Normal range of motion and phonation normal. Neck supple.  Cardiovascular: Normal rate and regular rhythm.   Pulmonary/Chest: Effort normal and breath sounds normal. She exhibits tenderness.  Abdominal: Soft. She exhibits no distension. There is no tenderness. There is no guarding.  Musculoskeletal: Normal range of motion.  Neurological: She is alert and oriented to person, place, and time. She exhibits normal muscle tone.  Skin: Skin is warm and dry.  Psychiatric: She has a normal mood and affect. Her behavior is normal. Judgment and thought content normal.  Nursing note and vitals reviewed.  ED Treatments / Results  DIAGNOSTIC STUDIES: Oxygen Saturation is 97% on RA, normal by my interpretation.    COORDINATION OF CARE: 3:45 AM-Discussed treatment plan which includes EKG, chest x-ray, and labs with pt at bedside and pt agreed to plan.    Labs (all labs ordered are listed, but only abnormal results are displayed) Labs Reviewed  BASIC METABOLIC PANEL - Abnormal; Notable for the following:       Result Value   Glucose, Bld 109 (*)    Calcium 8.7 (*)    All other components within normal limits  CBC  D-DIMER, QUANTITATIVE (NOT AT [facility])  Rosezena Sensor, ED    EKG  EKG Interpretation  Date/Time:  Sunday March 25 2016 03:04:16 EDT Ventricular Rate:  89 PR Interval:    QRS Duration: 92 QT Interval:  356 QTC Calculation: 434 R Axis:   68 Text Interpretation:  Sinus rhythm RSR' in V1 or V2, right VCD or RVH No old tracing to compare Confirmed by [facility]  MD, Flara Storti [phone]) on 03/25/2016 3:29:13 AM       Radiology Dg Chest 2 View  Result Date: 03/25/2016 CLINICAL DATA:  Acute onset of centralized chest  pain and shortness of breath. Initial encounter. EXAM: CHEST  2 VIEW COMPARISON:  None. FINDINGS: The lungs are well-aerated and clear. There is no evidence of focal opacification, pleural effusion or pneumothorax. The heart is normal in size; the mediastinal contour is within normal limits. No acute osseous abnormalities are seen. IMPRESSION: No acute cardiopulmonary process seen. Electronically Signed   By: Roanna Raider M.D.   On: 03/25/2016 03:45    Procedures Procedures (including critical care time)  Medications Ordered in ED Medications  ketorolac (TORADOL) 30 MG/ML injection 60 mg (60 mg Intramuscular Given 03/25/16 0356)  diazepam (VALIUM) tablet 10 mg (10 mg Oral Given 03/25/16 0356)  Initial Impression / Assessment and Plan / ED Course  I have reviewed the triage vital signs and the nursing notes.  Pertinent labs & imaging results that were available during my care of the patient were reviewed by me and considered in my medical decision making (see chart for details).  Clinical Course   Medications  ketorolac (TORADOL) 30 MG/ML injection 60 mg (60 mg Intramuscular Given 03/25/16 0356)  diazepam (VALIUM) tablet 10 mg (10 mg Oral Given 03/25/16 0356)    Patient Vitals for the past 24 hrs:  BP Temp Temp src Pulse Resp SpO2 Height Weight  03/25/16 0330 124/82 - - 79 12 99 % - -  03/25/16 0310 - - - - - - 5\' 7"  (1.702 m) 194 lb (88 kg)  03/25/16 0309 147/100 97.9 F (36.6 C) Oral 92 22 97 % - -    5:05 AM Reevaluation with update and discussion. After initial assessment and treatment, an updated evaluation reveals She states that she is more comfortable at this time. Findings discussed with the patient and all questions were answered. Tena Linebaugh L    Final Clinical Impressions(s) / ED Diagnoses   Final diagnoses:  Nonspecific chest pain    Nonspecific chest pain, with psychosocial stressors. She is currently off her psychiatric medications. Doubt ACS, PE or  pneumonia.  Nursing Notes Reviewed/ Care Coordinated Applicable Imaging Reviewed Interpretation of Laboratory Data incorporated into ED treatment  The patient appears reasonably screened and/or stabilized for discharge and I doubt any other medical condition or other [facility] requiring further screening, evaluation, or treatment in the ED at this time prior to discharge.  Plan: Home Medications- continue; Home Treatments- rest; return here if the recommended treatment, does not improve the symptoms; Recommended follow up- PCP and Psychiatry prn   I personally performed the services described in this documentation, which was scribed in my presence. The recorded information has been reviewed and is accurate.   New Prescriptions New Prescriptions   DIAZEPAM (VALIUM) 5 MG TABLET    Take 1 tablet (5 mg total) by mouth every 8 (eight) hours as needed for anxiety or muscle spasms (spasms).     Mancel BaleElliott Stevon Gough, MD 03/25/16 [id number]

## 2017-06-20 IMAGING — CR DG CHEST 2V
2 series · 2 of 2 positions shown · non-contrast
Comparison: None.

CLINICAL DATA: Acute onset of centralized chest pain and shortness
of breath. Initial encounter.

EXAM:
CHEST  2 VIEW

[w chest pa]
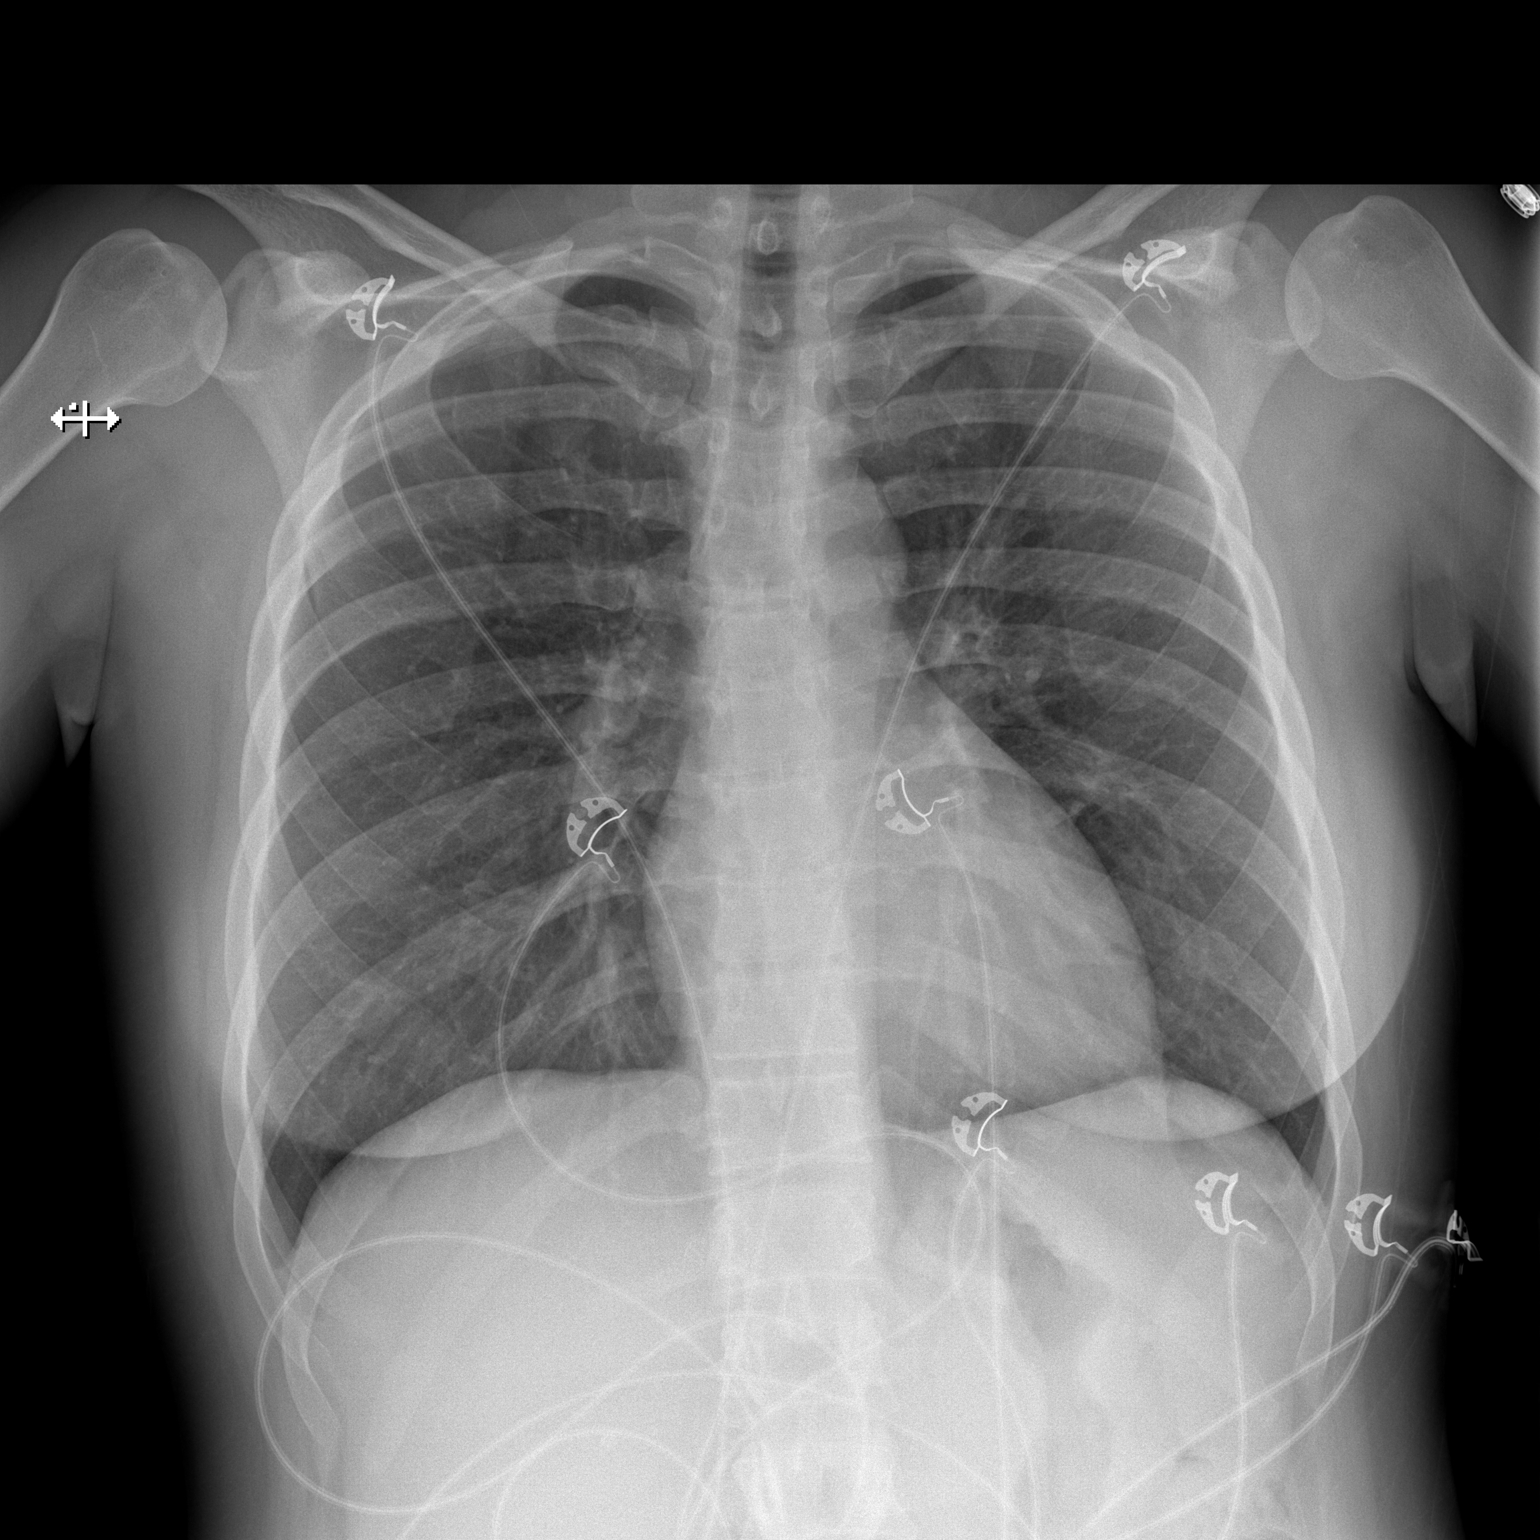

[w chest lat]
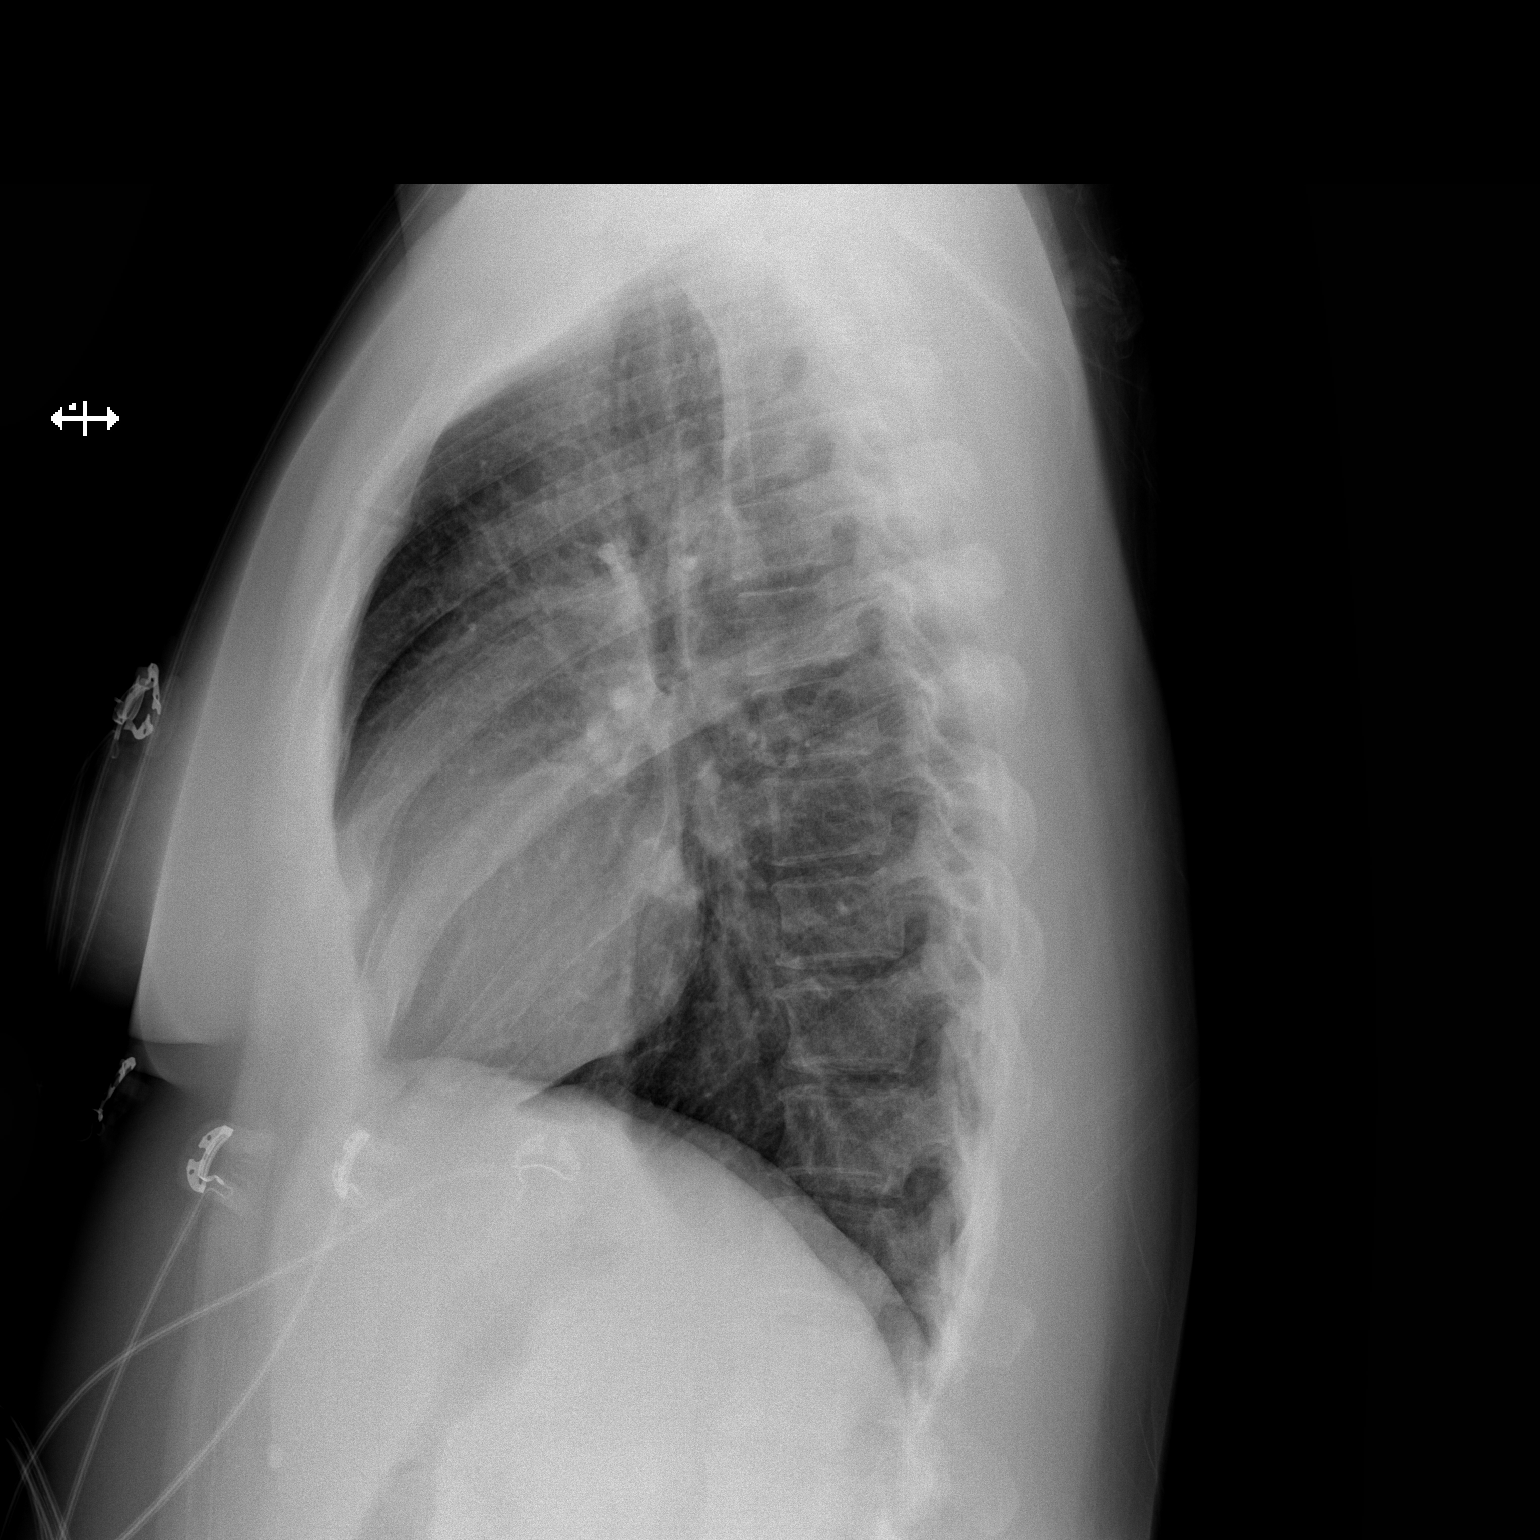

[2 of 2 positions shown; findings below may reference images not displayed]

FINDINGS: The lungs are well-aerated and clear. There is no evidence of focal
opacification, pleural effusion or pneumothorax.

The heart is normal in size; the mediastinal contour is within
normal limits. No acute osseous abnormalities are seen.
IMPRESSION: No acute cardiopulmonary process seen.
